# Patient Record
Sex: Male | Born: 1980 | Race: White | Hispanic: No | Marital: Married | Smoking: Never smoker
Health system: Southern US, Community
[De-identification: ages and names within clinical notes are randomized; demographics above are authoritative.]

## PROBLEM LIST (undated history)

## (undated) DIAGNOSIS — Z789 Other specified health status: Secondary | ICD-10-CM

## (undated) HISTORY — DX: Other specified health status: Z78.9

## (undated) HISTORY — PX: TOOTH EXTRACTION: SUR596

---

## 2016-12-12 ENCOUNTER — Telehealth: Payer: Self-pay | Admitting: Family Medicine

## 2016-12-12 ENCOUNTER — Ambulatory Visit: Payer: Self-pay | Admitting: Family Medicine

## 2016-12-12 NOTE — Telephone Encounter (Signed)
Pt lvm at 9:03 stating that he will not be able to make his appt.

## 2016-12-31 ENCOUNTER — Ambulatory Visit (INDEPENDENT_AMBULATORY_CARE_PROVIDER_SITE_OTHER): Payer: BC Managed Care – PPO | Admitting: Family Medicine

## 2016-12-31 ENCOUNTER — Encounter: Payer: Self-pay | Admitting: Family Medicine

## 2016-12-31 VITALS — BP 116/67 | HR 66 | Temp 98.5°F | Ht 70.0 in | Wt 188.6 lb

## 2016-12-31 DIAGNOSIS — E663 Overweight: Secondary | ICD-10-CM | POA: Diagnosis not present

## 2016-12-31 DIAGNOSIS — Z13220 Encounter for screening for lipoid disorders: Secondary | ICD-10-CM | POA: Diagnosis not present

## 2016-12-31 DIAGNOSIS — Z0000 Encounter for general adult medical examination without abnormal findings: Secondary | ICD-10-CM | POA: Diagnosis not present

## 2016-12-31 DIAGNOSIS — Z131 Encounter for screening for diabetes mellitus: Secondary | ICD-10-CM

## 2016-12-31 NOTE — Patient Instructions (Signed)
150 minutes of exercise per week (5 days, 30 minutes per day) that gets your heart rate up. Consider resistance exercise as well- weight lifting.

## 2016-12-31 NOTE — Progress Notes (Signed)
Chief Complaint  Patient presents with  . Establish Care    cpe    Well Male Gregory Dodson is here for a complete physical.  New pt, has never had a PCP since college. His last physical was >1 year ago.  Current diet: in general, a "healthy" diet   Current exercise: None right now Weight trend: stable Does pt snore? No. Daytime fatigue? No. Seat belt? Yes.    HIV screening- yes Flu- refuses Tdap- >10 years ago Blood work- never  Past Medical History:  Diagnosis Date  . No known problems     Past Surgical History:  Procedure Laterality Date  . TOOTH EXTRACTION     Medications  Takes no medications routinely.   Allergies No Known Allergies   Family History History reviewed. No pertinent family history.   Social History   Social History  . Marital status: Married   Social History Main Topics  . Smoking status: Never Smoker  . Smokeless tobacco: Never Used  . Alcohol use Yes  . Drug use: No   Review of Systems: Constitutional:  no unexpected change in weight, no fevers or chills Eye:  no recent significant change in vision Ear/Nose/Mouth/Throat:  Ears:  no tinnitus or hearing loss Nose/Mouth/Throat:  +nasal congestion (improving), no bleeding, no sore throat and oral sores Cardiovascular:  no chest pain, no palpitations Respiratory:  no cough and no shortness of breath Gastrointestinal:  no abdominal pain, no change in bowel habits, no nausea, vomiting, diarrhea, or constipation and no black or bloody stool GU:  Male: negative for dysuria, frequency, and incontinence and negative for prostate symptoms Musculoskeletal/Extremities:  no pain, redness, or swelling of the joints Integumentary (Skin/Breast):  no abnormal skin lesions reported Neurologic:  no headaches, no numbness, tingling Endocrine:  weight changes, masses in the neck, heat/cold intolerance, bowel or skin changes, or cardiovascular system symptoms Hematologic/Lymphatic:  no abnormal bleeding, no  HIV risk factors, no night sweats, no swollen nodes, no weight loss  Exam BP 116/67 (BP Location: Left Arm, Patient Position: Sitting, Cuff Size: Large)   Pulse 66   Temp 98.5 F (36.9 C) (Oral)   Ht 5\' 10"  (1.778 m)   Wt 188 lb 9.6 oz (85.5 kg)   SpO2 98%   BMI 27.06 kg/m  General:  well developed, well nourished, in no apparent distress Skin:  no significant moles, warts, or growths Head:  no masses, lesions, or tenderness Eyes:  pupils equal and round, sclera anicteric without injection Ears:  canals without lesions, TMs shiny without retraction, no obvious effusion, no erythema Nose:  nares patent, septum midline, mucosa normal Throat/Pharynx:  lips and gingiva without lesion; tongue and uvula midline; non-inflamed pharynx; no exudates or postnasal drainage Neck: neck supple without adenopathy, thyromegaly, or masses Lungs:  clear to auscultation, breath sounds equal bilaterally, no respiratory distress Cardio:  regular rate and rhythm without murmurs, heart sounds without clicks or rubs Abdomen:  abdomen soft, nontender; bowel sounds normal; no masses or organomegaly Genital (male): circumcised penis, no lesions or discharge; testes present bilaterally without masses or tenderness Rectal: Deferred Musculoskeletal:  symmetrical muscle groups noted without atrophy or deformity Extremities:  no clubbing, cyanosis, or edema, no deformities, no skin discoloration Neuro:  gait normal; deep tendon reflexes normal and symmetric Psych: well oriented with normal range of affect and appropriate judgment/insight  Assessment and Plan  Well adult exam  Screening cholesterol level - Plan: Lipid panel  Overweight (BMI 25.0-29.9) - Plan: Comprehensive metabolic panel  Screening for diabetes mellitus - Plan: Hemoglobin A1c   Well 36 y.o. male. Counseled on diet and exercise. Immunizations, labs, and further orders as above. Follow up in 1 year pending the above workup. The patient  voiced understanding and agreement to the plan.  Jilda Rocheicholas [place], DO 12/31/16 4:09 PM

## 2016-12-31 NOTE — Progress Notes (Signed)
Pre visit review using our clinic review tool, if applicable. No additional management support is needed unless otherwise documented below in the visit note. 

## 2017-01-01 LAB — COMPREHENSIVE METABOLIC PANEL
ALBUMIN: 4.6 g/dL (ref 3.5–5.2)
ALK PHOS: 46 U/L (ref 39–117)
ALT: 14 U/L (ref 0–53)
AST: 51 U/L — ABNORMAL HIGH (ref 0–37)
BILIRUBIN TOTAL: 0.5 mg/dL (ref 0.2–1.2)
BUN: 10 mg/dL (ref 6–23)
CO2: 35 mEq/L — ABNORMAL HIGH (ref 19–32)
Calcium: 9.4 mg/dL (ref 8.4–10.5)
Chloride: 102 mEq/L (ref 96–112)
Creatinine, Ser: 1.09 mg/dL (ref 0.40–1.50)
GFR: 80.54 mL/min (ref >60.00)
Glucose, Bld: 70 mg/dL (ref 70–99)
POTASSIUM: 4 meq/L (ref 3.5–5.1)
Sodium: 141 mEq/L (ref 135–145)
TOTAL PROTEIN: 6.9 g/dL (ref 6.0–8.3)

## 2017-01-01 LAB — LIPID PANEL
CHOLESTEROL: 203 mg/dL — AB (ref 0–200)
HDL: 51.3 mg/dL (ref >39.00)
LDL Cholesterol: 131 mg/dL — ABNORMAL HIGH (ref 0–99)
NonHDL: 151.29
Total CHOL/HDL Ratio: 4
Triglycerides: 101 mg/dL (ref 0.0–149.0)
VLDL: 20.2 mg/dL (ref 0.0–40.0)

## 2017-01-01 LAB — HEMOGLOBIN A1C: Hgb A1c MFr Bld: 5.4 % (ref 4.6–6.5)

## 2017-01-13 ENCOUNTER — Other Ambulatory Visit: Payer: Self-pay | Admitting: Family Medicine

## 2017-01-13 DIAGNOSIS — R740 Nonspecific elevation of levels of transaminase and lactic acid dehydrogenase [LDH]: Principal | ICD-10-CM

## 2017-01-13 DIAGNOSIS — R7401 Elevation of levels of liver transaminase levels: Secondary | ICD-10-CM

## 2017-01-15 ENCOUNTER — Other Ambulatory Visit (INDEPENDENT_AMBULATORY_CARE_PROVIDER_SITE_OTHER): Payer: BC Managed Care – PPO

## 2017-01-15 DIAGNOSIS — R740 Nonspecific elevation of levels of transaminase and lactic acid dehydrogenase [LDH]: Secondary | ICD-10-CM

## 2017-01-15 DIAGNOSIS — R7401 Elevation of levels of liver transaminase levels: Secondary | ICD-10-CM

## 2017-01-15 LAB — CBC WITH DIFFERENTIAL/PLATELET
BASOS PCT: 0.4 % (ref 0.0–3.0)
Basophils Absolute: 0 10*3/uL (ref 0.0–0.1)
EOS PCT: 2.5 % (ref 0.0–5.0)
Eosinophils Absolute: 0.2 10*3/uL (ref 0.0–0.7)
HCT: 44.9 % (ref 39.0–52.0)
Hemoglobin: 15.1 g/dL (ref 13.0–17.0)
LYMPHS ABS: 2.6 10*3/uL (ref 0.7–4.0)
Lymphocytes Relative: 37.5 % (ref 12.0–46.0)
MCHC: 33.7 g/dL (ref 30.0–36.0)
MCV: 89.1 fl (ref 78.0–100.0)
MONOS PCT: 7.8 % (ref 3.0–12.0)
Monocytes Absolute: 0.5 10*3/uL (ref 0.1–1.0)
NEUTROS ABS: 3.5 10*3/uL (ref 1.4–7.7)
NEUTROS PCT: 51.8 % (ref 43.0–77.0)
Platelets: 229 10*3/uL (ref 150.0–400.0)
RBC: 5.04 Mil/uL (ref 4.22–5.81)
RDW: 13.7 % (ref 11.5–15.5)
WBC: 6.8 10*3/uL (ref 4.0–10.5)

## 2017-01-15 LAB — HEPATIC FUNCTION PANEL
ALK PHOS: 41 U/L (ref 39–117)
ALT: 15 U/L (ref 0–53)
AST: 13 U/L (ref 0–37)
Albumin: 4.5 g/dL (ref 3.5–5.2)
BILIRUBIN DIRECT: 0.1 mg/dL (ref 0.0–0.3)
Total Bilirubin: 0.5 mg/dL (ref 0.2–1.2)
Total Protein: 7.1 g/dL (ref 6.0–8.3)

## 2017-01-15 LAB — IBC PANEL
IRON: 82 ug/dL (ref 42–165)
Saturation Ratios: 29.6 % (ref 20.0–50.0)
Transferrin: 198 mg/dL — ABNORMAL LOW (ref 212.0–360.0)

## 2017-01-16 LAB — HEPATITIS C ANTIBODY: HCV Ab: NEGATIVE

## 2017-01-16 LAB — HEPATITIS B SURFACE ANTIGEN: Hepatitis B Surface Ag: NEGATIVE

## 2017-11-30 ENCOUNTER — Other Ambulatory Visit: Payer: Self-pay | Admitting: Family Medicine

## 2017-11-30 ENCOUNTER — Telehealth: Payer: Self-pay

## 2017-11-30 DIAGNOSIS — Z3009 Encounter for other general counseling and advice on contraception: Secondary | ICD-10-CM

## 2017-11-30 NOTE — Telephone Encounter (Signed)
Please refer to urology. TY.

## 2017-11-30 NOTE — Telephone Encounter (Signed)
Referral entered  

## 2017-11-30 NOTE — Telephone Encounter (Signed)
Copied from CRM [id number]. Topic: Referral - Request >> Nov 30, 2017 10:34 AM Jolayne Hainesaylor, Brittany L wrote: Reason for CRM: Pt is requesting a referral for vasectomy. Please call back @ [phone]

## 2017-11-30 NOTE — Telephone Encounter (Signed)
Please advise 

## 2018-01-04 ENCOUNTER — Encounter: Payer: Self-pay | Admitting: Family Medicine

## 2018-01-04 ENCOUNTER — Ambulatory Visit (INDEPENDENT_AMBULATORY_CARE_PROVIDER_SITE_OTHER): Payer: BC Managed Care – PPO | Admitting: Family Medicine

## 2018-01-04 VITALS — BP 108/62 | HR 81 | Temp 98.6°F | Ht 70.0 in | Wt 193.0 lb

## 2018-01-04 DIAGNOSIS — Z0000 Encounter for general adult medical examination without abnormal findings: Secondary | ICD-10-CM

## 2018-01-04 LAB — COMPREHENSIVE METABOLIC PANEL
ALT: 17 U/L (ref 0–53)
AST: 24 U/L (ref 0–37)
Albumin: 4.3 g/dL (ref 3.5–5.2)
Alkaline Phosphatase: 36 U/L — ABNORMAL LOW (ref 39–117)
BUN: 14 mg/dL (ref 6–23)
CHLORIDE: 103 meq/L (ref 96–112)
CO2: 31 meq/L (ref 19–32)
Calcium: 9.6 mg/dL (ref 8.4–10.5)
Creatinine, Ser: 0.95 mg/dL (ref 0.40–1.50)
GFR: 93.88 mL/min (ref >60.00)
GLUCOSE: 87 mg/dL (ref 70–99)
POTASSIUM: 4.4 meq/L (ref 3.5–5.1)
SODIUM: 141 meq/L (ref 135–145)
Total Bilirubin: 0.5 mg/dL (ref 0.2–1.2)
Total Protein: 6.9 g/dL (ref 6.0–8.3)

## 2018-01-04 LAB — LIPID PANEL
CHOL/HDL RATIO: 3
Cholesterol: 174 mg/dL (ref 0–200)
HDL: 61.9 mg/dL (ref >39.00)
LDL Cholesterol: 99 mg/dL (ref 0–99)
NONHDL: 112.57
Triglycerides: 69 mg/dL (ref 0.0–149.0)
VLDL: 13.8 mg/dL (ref 0.0–40.0)

## 2018-01-04 NOTE — Progress Notes (Signed)
Pre visit review using our clinic review tool, if applicable. No additional management support is needed unless otherwise documented below in the visit note. 

## 2018-01-04 NOTE — Progress Notes (Signed)
Chief Complaint  Patient presents with  . Annual Exam    Well Male Gregory Dodson is here for a complete physical.   His last physical was >1 year ago.  Current diet: in general, a "healthy" diet   Current exercise: lifting weights, some cardio Weight trend: stable Does pt snore? No. Daytime fatigue? No. Seat belt? Yes.     Health maintenance Tetanus- Yes 1 yr 12/31/16 HIV- Yes  Past Medical History:  Diagnosis Date  . No known problems     Past Surgical History:  Procedure Laterality Date  . TOOTH EXTRACTION     Medications  Takes no meds routinely.   Allergies No Known Allergies   Family History History reviewed. No pertinent family history.  Review of Systems: Constitutional: no fevers or chills Eye:  no recent significant change in vision Ear/Nose/Mouth/Throat:  Ears:  no tinnitus or hearing loss Nose/Mouth/Throat:  no complaints of nasal congestion or bleeding, no sore throat Cardiovascular:  no chest pain, no palpitations Respiratory:  no cough and no shortness of breath Gastrointestinal:  no abdominal pain, no change in bowel habits, no nausea, vomiting, diarrhea, or constipation and no black or bloody stool GU:  Male: negative for dysuria, frequency, and incontinence and negative for prostate symptoms Musculoskeletal/Extremities:  no pain, redness, or swelling of the joints Integumentary (Skin/Breast): Lesion on back that his wife wanted looked at; otherwise no abnormal skin lesions reported Neurologic:  no headaches, no numbness, tingling Endocrine: No unexpected weight changes Hematologic/Lymphatic:  no night sweats  Exam BP 108/62 (BP Location: Left Arm, Patient Position: Sitting, Cuff Size: Normal)   Pulse 81   Temp 98.6 F (37 C) (Oral)   Ht 5\' 10"  (1.778 m)   Wt 193 lb (87.5 kg)   SpO2 97%   BMI 27.69 kg/m  General:  well developed, well nourished, in no apparent distress Skin: see below; otherwise no significant moles, warts, or growths Head:   no masses, lesions, or tenderness Eyes:  pupils equal and round, sclera anicteric without injection Ears:  canals without lesions, TMs shiny without retraction, no obvious effusion, no erythema Nose:  nares patent, septum midline, mucosa normal Throat/Pharynx:  lips and gingiva without lesion; tongue and uvula midline; non-inflamed pharynx; no exudates or postnasal drainage Neck: neck supple without adenopathy, thyromegaly, or masses Lungs:  clear to auscultation, breath sounds equal bilaterally, no respiratory distress Cardio:  regular rate and rhythm without murmurs, heart sounds without clicks or rubs Abdomen:  abdomen soft, nontender; bowel sounds normal; no masses or organomegaly Genital (male): circumcised penis, no lesions or discharge; testes present bilaterally without masses or tenderness Rectal: Deferred Musculoskeletal:  symmetrical muscle groups noted without atrophy or deformity Extremities:  no clubbing, cyanosis, or edema, no deformities, no skin discoloration Neuro:  gait normal; deep tendon reflexes normal and symmetric Psych: well oriented with normal range of affect and appropriate judgment/insight    Assessment and Plan  Well adult exam - Plan: Comprehensive metabolic panel, Lipid panel   Well 37 y.o. male. Counseled on diet and exercise. Doing well.  Skin lesions looks like a junctional nevus, offered to remove to be certain, he would like to monitor.  Other orders as above. Follow up in 1 year pending the above workup. The patient voiced understanding and agreement to the plan.  Jilda Rocheicholas [place], DO 01/04/18 8:50 AM

## 2018-01-04 NOTE — Patient Instructions (Signed)
We will send you a MyChart message regarding your lab results.   Let us know if you need anything.

## 2019-01-14 ENCOUNTER — Ambulatory Visit (INDEPENDENT_AMBULATORY_CARE_PROVIDER_SITE_OTHER): Payer: BC Managed Care – PPO | Admitting: Family Medicine

## 2019-01-14 ENCOUNTER — Encounter: Payer: Self-pay | Admitting: Family Medicine

## 2019-01-14 VITALS — BP 102/62 | HR 74 | Temp 98.3°F | Ht 70.0 in | Wt 182.0 lb

## 2019-01-14 DIAGNOSIS — Z0000 Encounter for general adult medical examination without abnormal findings: Secondary | ICD-10-CM | POA: Diagnosis not present

## 2019-01-14 LAB — COMPREHENSIVE METABOLIC PANEL
ALBUMIN: 4.6 g/dL (ref 3.5–5.2)
ALT: 11 U/L (ref 0–53)
AST: 13 U/L (ref 0–37)
Alkaline Phosphatase: 41 U/L (ref 39–117)
BUN: 14 mg/dL (ref 6–23)
CALCIUM: 9.7 mg/dL (ref 8.4–10.5)
CHLORIDE: 102 meq/L (ref 96–112)
CO2: 32 meq/L (ref 19–32)
Creatinine, Ser: 1.06 mg/dL (ref 0.40–1.50)
GFR: 77.43 mL/min (ref >60.00)
Glucose, Bld: 90 mg/dL (ref 70–99)
POTASSIUM: 4.4 meq/L (ref 3.5–5.1)
SODIUM: 139 meq/L (ref 135–145)
Total Bilirubin: 0.7 mg/dL (ref 0.2–1.2)
Total Protein: 6.8 g/dL (ref 6.0–8.3)

## 2019-01-14 LAB — LIPID PANEL
CHOL/HDL RATIO: 4
CHOLESTEROL: 190 mg/dL (ref 0–200)
HDL: 53.5 mg/dL (ref >39.00)
LDL CALC: 125 mg/dL — AB (ref 0–99)
NonHDL: 136.24
TRIGLYCERIDES: 55 mg/dL (ref 0.0–149.0)
VLDL: 11 mg/dL (ref 0.0–40.0)

## 2019-01-14 NOTE — Patient Instructions (Addendum)
Give us 2-3 business days to get the results of your labs back.   Keep the diet clean and stay active.  Do monthly self testicular checks in the shower. You are feeling for lumps/bumps that don't belong. If you feel anything like this, let me know!  Let us know if you need anything. 

## 2019-01-14 NOTE — Progress Notes (Signed)
Chief Complaint  Patient presents with  . Annual Exam    Well Male Gregory Dodson is here for a complete physical.   His last physical was >1 year ago.  Current diet: in general, convenient diet, fair overall Current exercise: [employer] [facility] Weight trend: has lost a some weight Daytime fatigue? No. Seat belt? Yes.    Health maintenance Tetanus- Yes HIV- Yes  Past Medical History:  Diagnosis Date  . No known problems      Past Surgical History:  Procedure Laterality Date  . TOOTH EXTRACTION      Medications  Takes no meds routinely.   Allergies No Known Allergies  Family History Family History  Problem Relation Age of Onset  . Cancer Neg Hx     Review of Systems: Constitutional: no fevers or chills Eye:  no recent significant change in vision Ear/Nose/Mouth/Throat:  Ears:  no tinnitus or hearing loss Nose/Mouth/Throat:  no complaints of nasal congestion, no sore throat Cardiovascular:  no chest pain, no palpitations Respiratory:  no cough and no shortness of breath Gastrointestinal:  no abdominal pain, no change in bowel habits GU:  Male: negative for dysuria, frequency, and incontinence and negative for prostate symptoms Musculoskeletal/Extremities:  no pain, redness, or swelling of the joints Integumentary (Skin/Breast):  no abnormal skin lesions reported Neurologic:  no headaches, no numbness, tingling Endocrine: No unexpected weight changes Hematologic/Lymphatic:  no night sweats  Exam BP 102/62 (BP Location: Left Arm, Patient Position: Sitting, Cuff Size: Normal)   Pulse 74   Temp 98.3 F (36.8 C) (Oral)   Ht 5\' 10"  (1.778 m)   Wt 182 lb (82.6 kg)   SpO2 98%   BMI 26.11 kg/m  General:  well developed, well nourished, in no apparent distress Skin:  no significant moles, warts, or growths Head:  no masses, lesions, or tenderness Eyes:  pupils equal and round, sclera anicteric without injection Ears:  canals without lesions, TMs shiny without  retraction, no obvious effusion, no erythema Nose:  nares patent, septum midline, mucosa normal Throat/Pharynx:  lips and gingiva without lesion; tongue and uvula midline; non-inflamed pharynx; no exudates or postnasal drainage Neck: neck supple without adenopathy, thyromegaly, or masses Lungs:  clear to auscultation, breath sounds equal bilaterally, no respiratory distress Cardio:  regular rate and rhythm, no bruits, no LE edema Abdomen:  abdomen soft, nontender; bowel sounds normal; no masses or organomegaly Genital (male): circumcised penis, no lesions or discharge; testes present bilaterally without masses or tenderness Rectal: Deferred Musculoskeletal:  symmetrical muscle groups noted without atrophy or deformity Extremities:  no clubbing, cyanosis, or edema, no deformities, no skin discoloration Neuro:  gait normal; deep tendon reflexes normal and symmetric Psych: well oriented with normal range of affect and appropriate judgment/insight  Assessment and Plan  Well adult exam - Plan: Lipid panel, Comprehensive metabolic panel   Well 38 y.o. male. Counseled on diet and exercise. Declines flu shot. Other orders as above. Follow up in 1 year pending the above workup. The patient voiced understanding and agreement to the plan.  Jilda Roche [place], DO 01/14/19 10:34 AM

## 2019-05-05 ENCOUNTER — Emergency Department (HOSPITAL_BASED_OUTPATIENT_CLINIC_OR_DEPARTMENT_OTHER): Payer: BC Managed Care – PPO

## 2019-05-05 ENCOUNTER — Other Ambulatory Visit: Payer: Self-pay

## 2019-05-05 ENCOUNTER — Emergency Department (HOSPITAL_BASED_OUTPATIENT_CLINIC_OR_DEPARTMENT_OTHER)
Admission: EM | Admit: 2019-05-05 | Discharge: 2019-05-05 | Disposition: A | Discharge status: Home / Self Care | Payer: BC Managed Care – PPO | Attending: Emergency Medicine | Admitting: Emergency Medicine

## 2019-05-05 ENCOUNTER — Encounter (HOSPITAL_BASED_OUTPATIENT_CLINIC_OR_DEPARTMENT_OTHER): Payer: Self-pay | Admitting: *Deleted

## 2019-05-05 DIAGNOSIS — S62315A Displaced fracture of base of fourth metacarpal bone, left hand, initial encounter for closed fracture: Secondary | ICD-10-CM | POA: Diagnosis not present

## 2019-05-05 DIAGNOSIS — Y999 Unspecified external cause status: Secondary | ICD-10-CM | POA: Insufficient documentation

## 2019-05-05 DIAGNOSIS — Y939 Activity, unspecified: Secondary | ICD-10-CM | POA: Diagnosis not present

## 2019-05-05 DIAGNOSIS — S62337A Displaced fracture of neck of fifth metacarpal bone, left hand, initial encounter for closed fracture: Secondary | ICD-10-CM | POA: Insufficient documentation

## 2019-05-05 DIAGNOSIS — S62319A Displaced fracture of base of unspecified metacarpal bone, initial encounter for closed fracture: Secondary | ICD-10-CM

## 2019-05-05 DIAGNOSIS — Y929 Unspecified place or not applicable: Secondary | ICD-10-CM | POA: Insufficient documentation

## 2019-05-05 DIAGNOSIS — W2209XA Striking against other stationary object, initial encounter: Secondary | ICD-10-CM | POA: Diagnosis not present

## 2019-05-05 DIAGNOSIS — S6992XA Unspecified injury of left wrist, hand and finger(s), initial encounter: Secondary | ICD-10-CM | POA: Diagnosis present

## 2019-05-05 MED ORDER — HYDROCODONE-ACETAMINOPHEN 5-325 MG PO TABS: 1.0000 | ORAL_TABLET | Freq: Four times a day (QID) | ORAL | 0 refills | Status: DC | PRN

## 2019-05-05 NOTE — ED Triage Notes (Signed)
Pt c/o left hand injury x 1 day

## 2019-05-05 NOTE — ED Provider Notes (Signed)
[facility] EMERGENCY DEPARTMENT Provider Note   CSN: [id number] Arrival date & time: 05/05/19  2122  History   Chief Complaint Chief Complaint  Patient presents with  . Hand Injury    HPI Gregory Dodson is a 38 y.o. male with no significant past medical history who presents for evaluation of hand injury.  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  Reoccurred to his left hand.  Patient has tenderness to his lateral metacarpals on his left upper extremity.  He has no overlying swelling, redness, warmth, lacerations, abrasions, contusions.  He is not take anything for his pain.  He rates his pain a 4/10.  Pain does not radiate.  He denies decreased range of motion or numbness or tingling in his extremities.  Symptom occurred 1 hour PTA.  Denies additional aggravating or alleviating factors.  He has not followed with orthopedics previously.  History obtained from patient.  No interpreter is used.     HPI  Past Medical History:  Diagnosis Date  . No known problems     There are no active problems to display for this patient.   Past Surgical History:  Procedure Laterality Date  . TOOTH EXTRACTION          Home Medications    Prior to Admission medications   Medication Sig Start Date End Date Taking? Authorizing Provider  HYDROcodone-acetaminophen (NORCO/VICODIN) 5-325 MG tablet Take 1 tablet by mouth every 6 (six) hours as needed for severe pain. 05/05/19   Maridee Slape A, PA-C    Family History Family History  Problem Relation Age of Onset  . Cancer Neg Hx     Social History Social History   Tobacco Use  . Smoking status: Never Smoker  . Smokeless tobacco: Never Used  Substance Use Topics  . Alcohol use: Yes    Comment: occ  . Drug use: No     Allergies   Patient has no known allergies.   Review of Systems Review of Systems  Constitutional: Negative.   HENT: Negative.   Respiratory: Negative.    Cardiovascular: Negative.   Gastrointestinal: Negative.   Genitourinary: Negative.   Musculoskeletal:       Left hand pain  Skin: Negative.   Neurological: Negative.   All other systems reviewed and are negative.    Physical Exam Updated Vital Signs BP 132/86   Pulse 87   Temp 98.7 F (37.1 C)   Resp 18   Ht  (1.778 m)   Wt 81.6 kg   SpO2 100%   BMI 25.83 kg/m   Physical Exam Vitals signs and nursing note reviewed.  Constitutional:      General: He is not in acute distress.    Appearance: He is well-developed. He is not ill-appearing, toxic-appearing or diaphoretic.  HENT:     Head: Normocephalic and atraumatic.     Nose: Nose normal.     Mouth/Throat:     Mouth: Mucous membranes are moist.     Pharynx: Oropharynx is clear.  Eyes:     Pupils: Pupils are equal, round, and reactive to light.  Neck:     Musculoskeletal: Normal range of motion and neck supple.  Cardiovascular:     Rate and Rhythm: Normal rate and regular rhythm.     Pulses: Normal pulses.     Heart sounds: Normal heart sounds. No murmur. No friction rub. No gallop.   Pulmonary:     Effort:  Pulmonary effort is normal. No respiratory distress.     Breath sounds: Normal breath sounds. No stridor. No wheezing, rhonchi or rales.  Chest:     Chest wall: No tenderness.  Abdominal:     General: Bowel sounds are normal. There is no distension.     Palpations: Abdomen is soft.     Tenderness: There is no abdominal tenderness. There is no guarding or rebound.  Musculoskeletal: Normal range of motion.     Comments: Range of motion bilateral upper extremities without difficulty.  He does have tenderness with mild swelling over his fourth and fifth metacarpal on his left upper extremity.  Compartments are soft.  Able to flex and extend at bilateral wrist.  He has no tenderness to his radius or ulna.  Moves digits without difficulty.  No tenderness over scaphoid.  Skin:    General: Skin is warm and dry.      Comments: No edema, erythema, ecchymosis or warmth.  Soft tissue swelling over fourth/fifth digit to his left upper extremity.  No overlying lacerations, contusions or abrasions.  He has brisk capillary refill.  Neurological:     Mental Status: He is alert.     Comments: 5/5 strength bilateral upper extremities no difficulty.  Ambulatory in ED without difficulty.    ED Treatments / Results  Labs (all labs ordered are listed, but only abnormal results are displayed) Labs Reviewed - No data to display  EKG None  Radiology Dg Hand Complete Left  Result Date: 05/05/2019 CLINICAL DATA:  Pain EXAM: LEFT HAND - COMPLETE 3+ VIEW COMPARISON:  None. FINDINGS: There are acute impacted fractures involving the base of the fifth and fourth metacarpals. There is surrounding soft tissue swelling. There is significant angulation of the distal fracture fragments. IMPRESSION: Acute fractures of the fourth and fifth metacarpals with significant ventral angulation of the distal fracture fragments. Electronically Signed   By: Katherine Mantle M.D.   On: 05/05/2019 21:45    Procedures Procedures (including critical care time)  Medications Ordered in ED Medications - No data to display   Initial Impression / Assessment and Plan / ED Course  I have reviewed the triage vital signs and the nursing notes.  Pertinent labs & imaging results that were available during my care of the patient were reviewed by me and considered in my medical decision making (see chart for details).  38 year old appears otherwise well presents for evaluation of left hand injury. Afebrile, nonseptic, non-ill-appearing.  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  He has tenderness with soft tissue swelling over the fourth and fifth metacarpal to his left upper extremity.  Normal musculoskeletal exam.  Neurovascularly intact.  No overlying lacerations, abrasions or contusions.  No open fracture. He has no evidence of  infectious process.  Plain film x-ray shows acute fractures of the fourth and fifth metacarpals with ventral angulation of distal fracture fragments.  Compartments soft. Patient placed in ulnar gutter splint with trial to reset fractures. Will have him follow-up with hand surgery/orthopedics. Patient continues to be neurovascular intact after splint placement.  Does not want anything for pain at this time. Will DC home with short course of pain medicine.  The patient has been appropriately medically screened and/or stabilized in the ED. I have low suspicion for any other emergent medical condition which would require further screening, evaluation or treatment in the ED or require inpatient management.  Patient is hemodynamically stable and in no acute distress.  Patient able  to ambulate in department prior to ED.  Evaluation does not show acute pathology that would require ongoing or additional emergent interventions while in the emergency department or further inpatient treatment.  I have discussed the diagnosis with the patient and answered all questions.  Pain is been managed while in the emergency department and patient has no further complaints prior to discharge.  Patient is comfortable with plan discussed in room and is stable for discharge at this time.  I have discussed strict return precautions for returning to the emergency department.  Patient was encouraged to follow-up with PCP/specialist refer to at discharge.     Discussed with attending Dr. Judd Lienelo who recommends Ulnar gutter splint with hand Surgery FU. Final Clinical Impressions(s) / ED Diagnoses   Final diagnoses:  Closed displaced fracture of base of metacarpal bone, unspecified fracture morphology, unspecified metacarpal, initial encounter    ED Discharge Orders         Ordered    HYDROcodone-acetaminophen (NORCO/VICODIN) 5-325 MG tablet  Every 6 hours PRN     05/05/19 2227           Daaiel Starlin A, PA-C 05/05/19 2247     Geoffery Lyonselo, [place], MD 05/06/19 2249

## 2019-05-05 NOTE — Discharge Instructions (Signed)
Your evaluated today for hand injury.  You do have fractures to 2 of the bones in your hand.  You are placed in a splint.  You will need to follow-up with orthopedic hand surgery.  I have referred you to 1 your discharge paperwork.  You will need to call to schedule an appointment.  I suggest calling tomorrow to schedule this appointment.  Also given you a prescription for pain medicine.  Please take as prescribed.  Do not drive or operate heavy machinery while taking this medicine.  Return to the ED for any new or worsening symptoms.

## 2019-10-30 IMAGING — CR LEFT HAND - COMPLETE 3+ VIEW
3 series · 3 of 3 positions shown · non-contrast
Comparison: None.

CLINICAL DATA: Pain

EXAM:
LEFT HAND - COMPLETE 3+ VIEW

[x hand pa left]
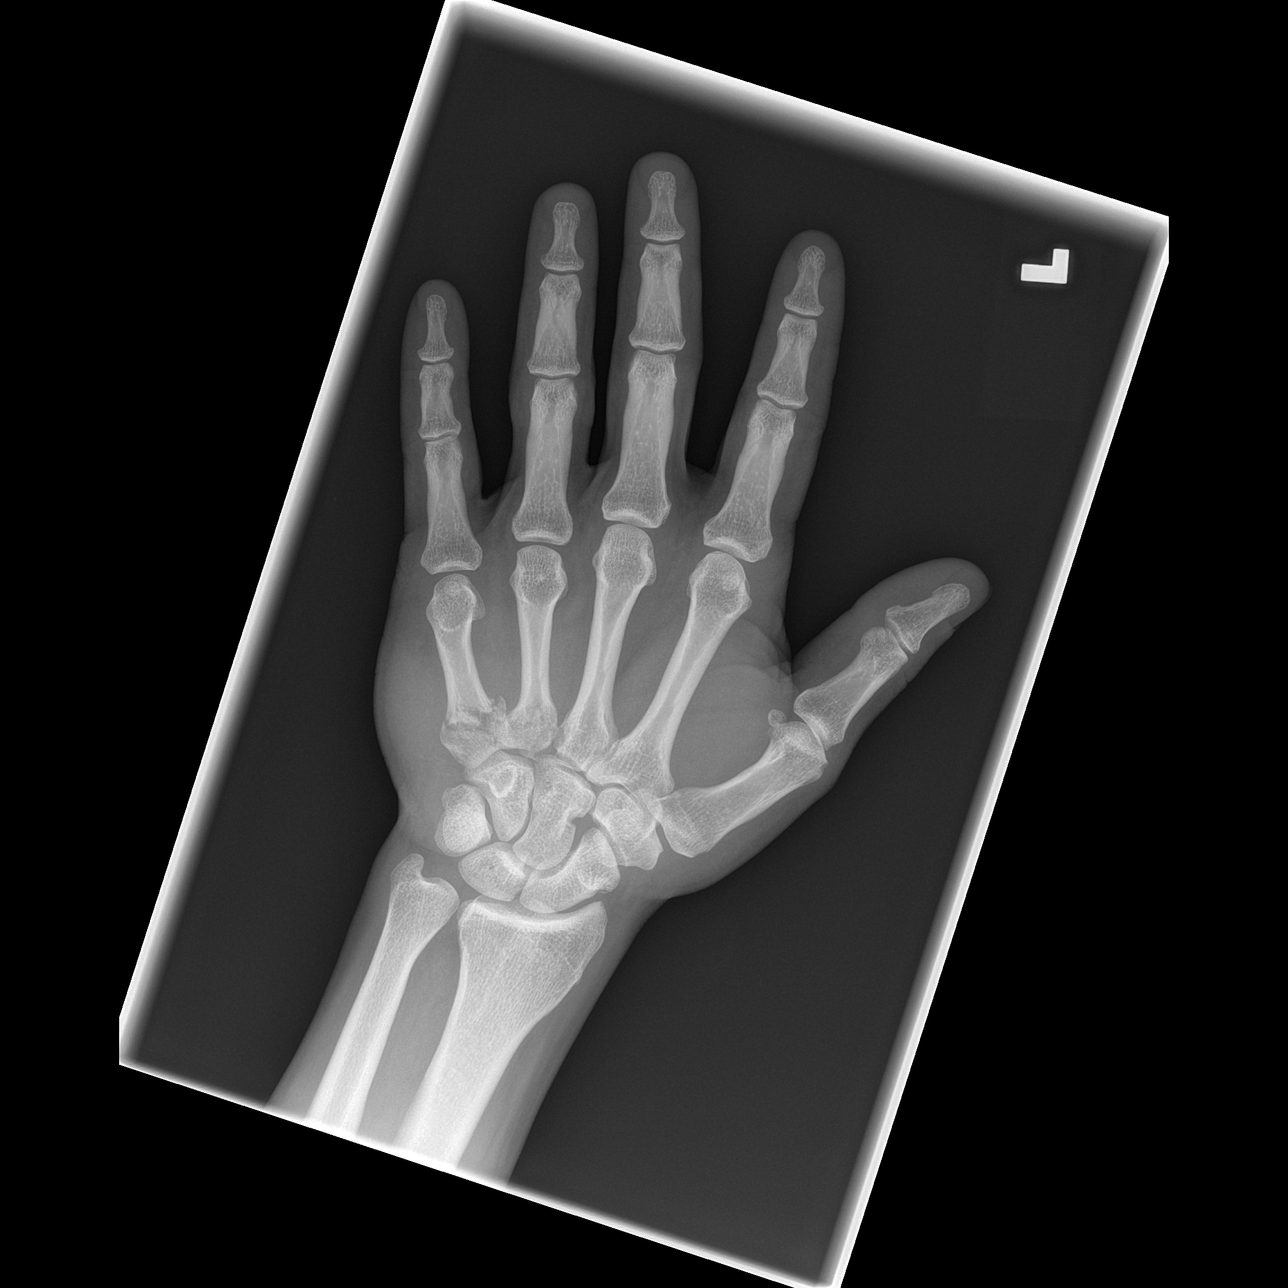

[x hand oblique left]
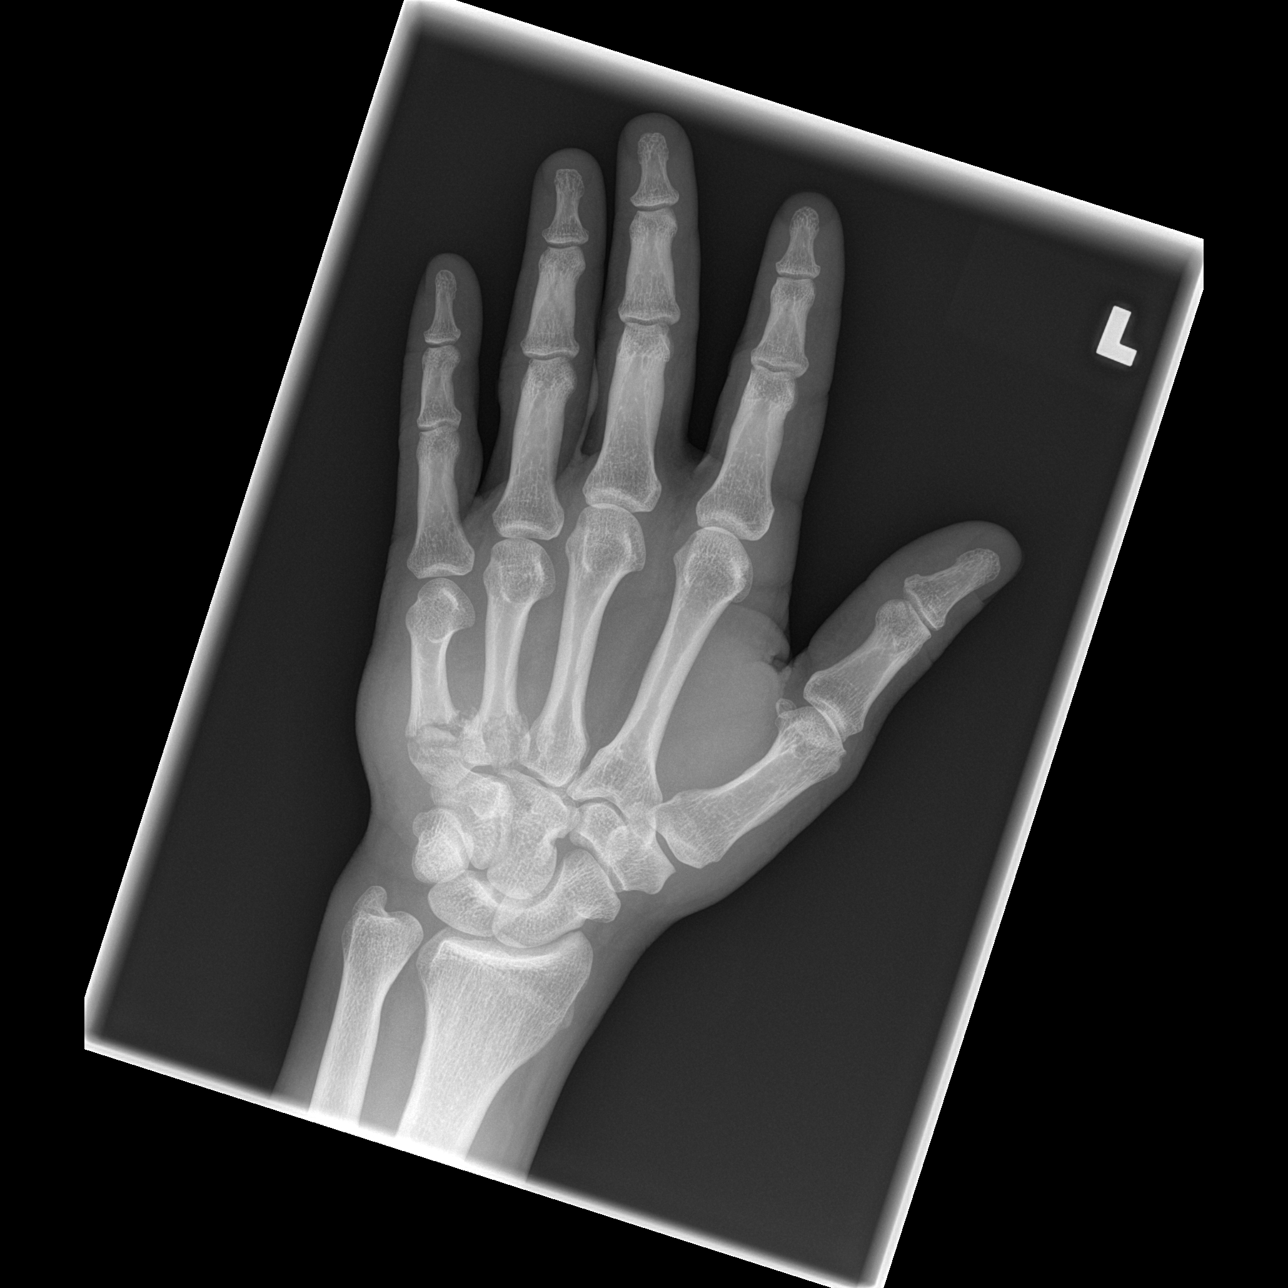

[x hand lat left]
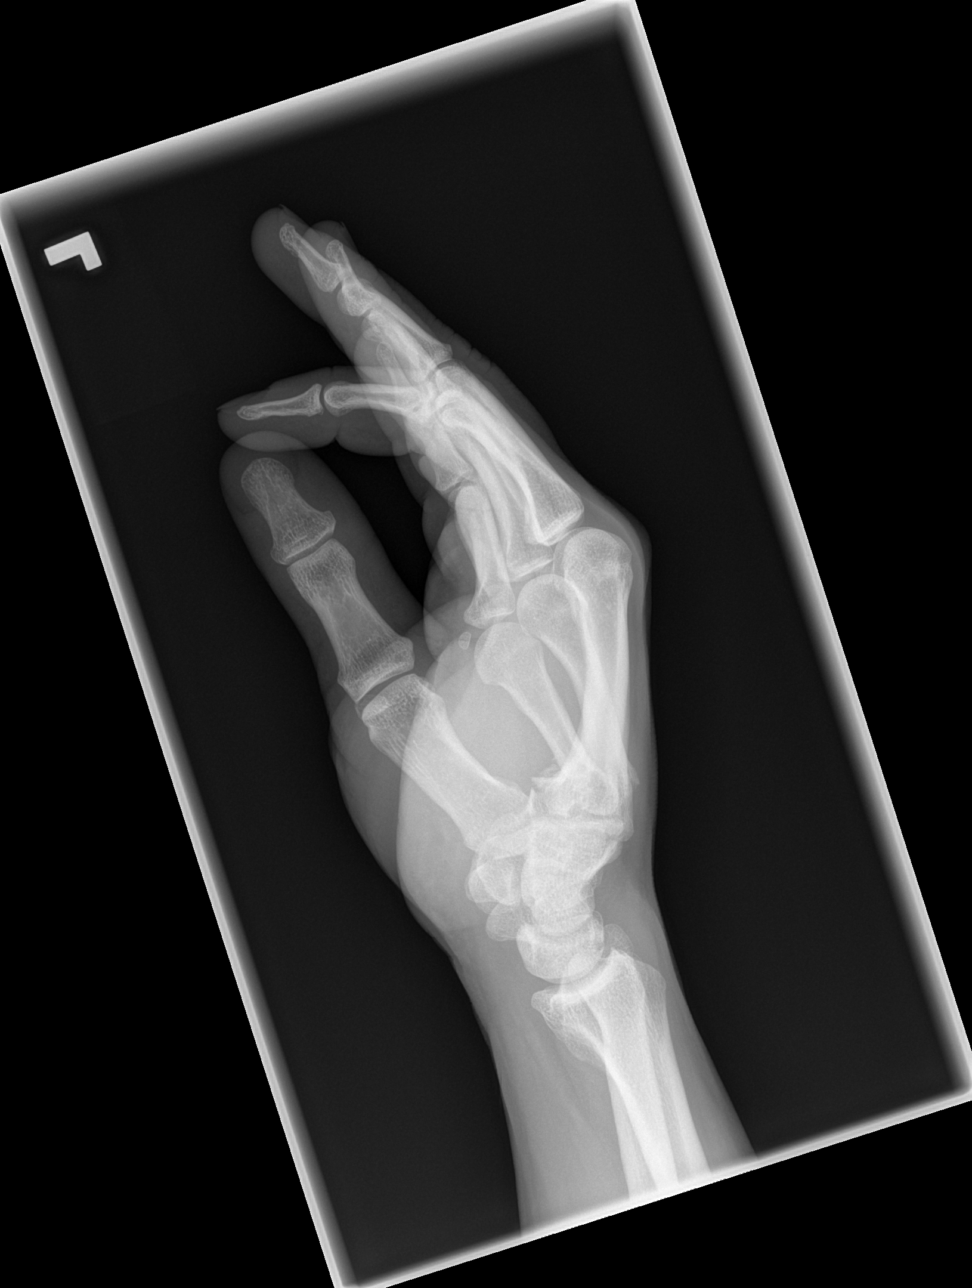

[3 of 3 positions shown; findings below may reference images not displayed]

FINDINGS: There are acute impacted fractures involving the base of the fifth
and fourth metacarpals. There is surrounding soft tissue swelling.
There is significant angulation of the distal fracture fragments.
IMPRESSION: Acute fractures of the fourth and fifth metacarpals with significant
ventral angulation of the distal fracture fragments.

## 2020-03-13 ENCOUNTER — Other Ambulatory Visit: Payer: Self-pay

## 2020-03-14 ENCOUNTER — Encounter: Payer: Self-pay | Admitting: Family Medicine

## 2020-03-14 ENCOUNTER — Ambulatory Visit (INDEPENDENT_AMBULATORY_CARE_PROVIDER_SITE_OTHER): Payer: BC Managed Care – PPO | Admitting: Family Medicine

## 2020-03-14 VITALS — BP 102/62 | HR 67 | Temp 96.4°F | Ht 70.0 in | Wt 186.1 lb

## 2020-03-14 DIAGNOSIS — Z0000 Encounter for general adult medical examination without abnormal findings: Secondary | ICD-10-CM | POA: Diagnosis not present

## 2020-03-14 LAB — COMPREHENSIVE METABOLIC PANEL
ALT: 13 U/L (ref 0–53)
AST: 13 U/L (ref 0–37)
Albumin: 4.5 g/dL (ref 3.5–5.2)
Alkaline Phosphatase: 40 U/L (ref 39–117)
BUN: 16 mg/dL (ref 6–23)
CO2: 29 mEq/L (ref 19–32)
Calcium: 9.4 mg/dL (ref 8.4–10.5)
Chloride: 102 mEq/L (ref 96–112)
Creatinine, Ser: 1.01 mg/dL (ref 0.40–1.50)
GFR: 81.39 mL/min (ref >60.00)
Glucose, Bld: 89 mg/dL (ref 70–99)
Potassium: 4.2 mEq/L (ref 3.5–5.1)
Sodium: 138 mEq/L (ref 135–145)
Total Bilirubin: 0.8 mg/dL (ref 0.2–1.2)
Total Protein: 6.6 g/dL (ref 6.0–8.3)

## 2020-03-14 LAB — CBC
HCT: 43.4 % (ref 39.0–52.0)
Hemoglobin: 14.8 g/dL (ref 13.0–17.0)
MCHC: 34.1 g/dL (ref 30.0–36.0)
MCV: 89.8 fl (ref 78.0–100.0)
Platelets: 196 10*3/uL (ref 150.0–400.0)
RBC: 4.83 Mil/uL (ref 4.22–5.81)
RDW: 13.3 % (ref 11.5–15.5)
WBC: 5.3 10*3/uL (ref 4.0–10.5)

## 2020-03-14 LAB — LIPID PANEL
Cholesterol: 194 mg/dL (ref 0–200)
HDL: 53.9 mg/dL (ref >39.00)
LDL Cholesterol: 128 mg/dL — ABNORMAL HIGH (ref 0–99)
NonHDL: 140.41
Total CHOL/HDL Ratio: 4
Triglycerides: 64 mg/dL (ref 0.0–149.0)
VLDL: 12.8 mg/dL (ref 0.0–40.0)

## 2020-03-14 NOTE — Patient Instructions (Signed)
Give us 2-3 business days to get the results of your labs back.   Keep the diet clean and stay active.  Let us know if you need anything. 

## 2020-03-14 NOTE — Progress Notes (Signed)
Chief Complaint  Patient presents with  . Annual Exam    Well Male Gregory Dodson is here for a complete physical.   His last physical was >1 year ago.  Current diet: in general, a "healthy" diet.   Current exercise: cycling, wt resistance exercises Weight trend: stable Daytime fatigue? No. Seat belt? Yes.    Health maintenance Tetanus- Yes HIV- Yes  Past Medical History:  Diagnosis Date  . No known problems      Past Surgical History:  Procedure Laterality Date  . TOOTH EXTRACTION      Medications  Takes no meds routinely.   Allergies No Known Allergies  Family History Family History  Problem Relation Age of Onset  . Colon polyps Father   . Cancer Neg Hx     Review of Systems: Constitutional: no fevers or chills Eye:  no recent significant change in vision Ear/Nose/Mouth/Throat:  Ears:  no hearing loss Nose/Mouth/Throat:  no complaints of nasal congestion, no sore throat Cardiovascular:  no chest pain Respiratory:  no shortness of breath Gastrointestinal:  no abdominal pain, no change in bowel habits GU:  Male: negative for dysuria, frequency, and incontinence Musculoskeletal/Extremities:  no pain of the joints Integumentary (Skin/Breast):  no abnormal skin lesions reported Neurologic:  no headaches Endocrine: No unexpected weight changes Hematologic/Lymphatic:  no night sweats  Exam BP 102/62 (BP Location: Left Arm, Patient Position: Sitting, Cuff Size: Normal)   Pulse 67   Temp (!) 96.4 F (35.8 C) (Temporal)   Ht 5\' 10"  (1.778 m)   Wt 186 lb 2 oz (84.4 kg)   SpO2 98%   BMI 26.71 kg/m  General:  well developed, well nourished, in no apparent distress Skin:  no significant moles, warts, or growths Head:  no masses, lesions, or tenderness Eyes:  pupils equal and round, sclera anicteric without injection Ears:  canals without lesions, TMs shiny without retraction, no obvious effusion, no erythema Nose:  nares patent, septum midline, mucosa  normal Throat/Pharynx:  lips and gingiva without lesion; tongue and uvula midline; non-inflamed pharynx; no exudates or postnasal drainage Neck: neck supple without adenopathy, thyromegaly, or masses Lungs:  clear to auscultation, breath sounds equal bilaterally, no respiratory distress Cardio:  regular rate and rhythm, no bruits, no LE edema Abdomen:  abdomen soft, nontender; bowel sounds normal; no masses or organomegaly Rectal: Deferred Musculoskeletal:  symmetrical muscle groups noted without atrophy or deformity Extremities:  no clubbing, cyanosis, or edema, no deformities, no skin discoloration Neuro:  gait normal; deep tendon reflexes normal and symmetric Psych: well oriented with normal range of affect and appropriate judgment/insight  Assessment and Plan  Well adult exam - Plan: CBC, Comprehensive metabolic panel, Lipid panel   Well 39 y.o. male. Counseled on diet and exercise. Counseled on risks and benefits of prostate cancer screening with PSA. The patient agrees to forego screening.  Other orders as above. Follow up in 1 year pending the above workup. The patient voiced understanding and agreement to the plan.  46 [place], DO 03/14/20 9:01 AM

## 2021-03-18 ENCOUNTER — Encounter: Payer: BC Managed Care – PPO | Admitting: Family Medicine

## 2021-04-22 ENCOUNTER — Other Ambulatory Visit: Payer: Self-pay

## 2021-04-22 ENCOUNTER — Encounter: Payer: Self-pay | Admitting: Family Medicine

## 2021-04-22 ENCOUNTER — Ambulatory Visit (INDEPENDENT_AMBULATORY_CARE_PROVIDER_SITE_OTHER): Payer: BC Managed Care – PPO | Admitting: Family Medicine

## 2021-04-22 VITALS — BP 120/72 | HR 69 | Temp 98.0°F | Ht 70.0 in | Wt 187.4 lb

## 2021-04-22 DIAGNOSIS — Z0000 Encounter for general adult medical examination without abnormal findings: Secondary | ICD-10-CM

## 2021-04-22 LAB — LIPID PANEL
Cholesterol: 183 mg/dL (ref 0–200)
HDL: 47.9 mg/dL (ref >39.00)
LDL Cholesterol: 119 mg/dL — ABNORMAL HIGH (ref 0–99)
NonHDL: 134.72
Total CHOL/HDL Ratio: 4
Triglycerides: 78 mg/dL (ref 0.0–149.0)
VLDL: 15.6 mg/dL (ref 0.0–40.0)

## 2021-04-22 LAB — COMPREHENSIVE METABOLIC PANEL
ALT: 15 U/L (ref 0–53)
AST: 12 U/L (ref 0–37)
Albumin: 4.3 g/dL (ref 3.5–5.2)
Alkaline Phosphatase: 42 U/L (ref 39–117)
BUN: 14 mg/dL (ref 6–23)
CO2: 31 mEq/L (ref 19–32)
Calcium: 9.2 mg/dL (ref 8.4–10.5)
Chloride: 104 mEq/L (ref 96–112)
Creatinine, Ser: 0.98 mg/dL (ref 0.40–1.50)
GFR: 95.35 mL/min (ref >60.00)
Glucose, Bld: 97 mg/dL (ref 70–99)
Potassium: 4 mEq/L (ref 3.5–5.1)
Sodium: 140 mEq/L (ref 135–145)
Total Bilirubin: 0.6 mg/dL (ref 0.2–1.2)
Total Protein: 6.5 g/dL (ref 6.0–8.3)

## 2021-04-22 LAB — CBC
HCT: 42.5 % (ref 39.0–52.0)
Hemoglobin: 14.4 g/dL (ref 13.0–17.0)
MCHC: 34 g/dL (ref 30.0–36.0)
MCV: 88.9 fl (ref 78.0–100.0)
Platelets: 231 10*3/uL (ref 150.0–400.0)
RBC: 4.78 Mil/uL (ref 4.22–5.81)
RDW: 13.3 % (ref 11.5–15.5)
WBC: 6.4 10*3/uL (ref 4.0–10.5)

## 2021-04-22 NOTE — Progress Notes (Signed)
Chief Complaint  Patient presents with  . Annual Exam    Well Male Gregory Dodson is here for a complete physical.   His last physical was >1 year ago.  Current diet: in general, a "healthy" diet.   Current exercise: none Weight trend: stable Fatigue out of ordinary? No. Seat belt? Yes.  Loss of interested in doing things or depression in past 2 weeks? No  Health maintenance Tetanus- Yes HIV- Yes Hep C- Yes  Past Medical History:  Diagnosis Date  . No known problems      Past Surgical History:  Procedure Laterality Date  . TOOTH EXTRACTION      Medications  Takes no meds routinely.    Allergies No Known Allergies  Family History Family History  Problem Relation Age of Onset  . Colon polyps Father   . Cancer Neg Hx     Review of Systems: Constitutional: no fevers or chills Eye:  no recent significant change in vision Ear/Nose/Mouth/Throat:  Ears:  no hearing loss Nose/Mouth/Throat:  no complaints of nasal congestion, no sore throat Cardiovascular:  no chest pain Respiratory:  no shortness of breath Gastrointestinal:  no abdominal pain, no change in bowel habits GU:  Male: negative for dysuria, frequency, and incontinence Musculoskeletal/Extremities:  no pain of the joints Integumentary (Skin/Breast):  no abnormal skin lesions reported Neurologic:  no headaches Endocrine: No unexpected weight changes Hematologic/Lymphatic:  no night sweats  Exam BP 120/72 (BP Location: Left Arm, Patient Position: Sitting, Cuff Size: Normal)   Pulse 69   Temp 98 F (36.7 C) (Oral)   Ht 5\' 10"  (1.778 m)   Wt 187 lb 6 oz (85 kg)   SpO2 96%   BMI 26.89 kg/m  General:  well developed, well nourished, in no apparent distress Skin:  no significant moles, warts, or growths Head:  no masses, lesions, or tenderness Eyes:  pupils equal and round, sclera anicteric without injection Ears:  canals without lesions, TMs shiny without retraction, no obvious effusion, no  erythema Nose:  nares patent, septum midline, mucosa normal Throat/Pharynx:  lips and gingiva without lesion; tongue and uvula midline; non-inflamed pharynx; no exudates or postnasal drainage Neck: neck supple without adenopathy, thyromegaly, or masses Lungs:  clear to auscultation, breath sounds equal bilaterally, no respiratory distress Cardio:  regular rate and rhythm, no bruits, no LE edema Abdomen:  abdomen soft, nontender; bowel sounds normal; no masses or organomegaly Rectal: Deferred Musculoskeletal:  symmetrical muscle groups noted without atrophy or deformity Extremities:  no clubbing, cyanosis, or edema, no deformities, no skin discoloration Neuro:  gait normal; deep tendon reflexes normal and symmetric Psych: well oriented with normal range of affect and appropriate judgment/insight  Assessment and Plan  Well adult exam - Plan: CBC, Comprehensive metabolic panel, Lipid panel   [name]. Counseled on diet and exercise. Counseled on risks and benefits of prostate cancer screening with PSA. The patient agrees to forego screening.  Other orders as above. Follow up in 1 yr pending the above workup. The patient voiced understanding and agreement to the plan.  45 [place], DO 04/22/21 9:25 AM

## 2021-04-22 NOTE — Patient Instructions (Signed)
Give us 2-3 business days to get the results of your labs back.   Keep the diet clean and stay active.  Aim to do some physical exertion for 150 minutes per week. This is typically divided into 5 days per week, 30 minutes per day. The activity should be enough to get your heart rate up. Anything is better than nothing if you have time constraints.  Let us know if you need anything.  

## 2021-08-02 ENCOUNTER — Telehealth: Payer: BC Managed Care – PPO | Admitting: Family Medicine

## 2021-08-02 DIAGNOSIS — J208 Acute bronchitis due to other specified organisms: Secondary | ICD-10-CM

## 2021-08-02 DIAGNOSIS — B9689 Other specified bacterial agents as the cause of diseases classified elsewhere: Secondary | ICD-10-CM

## 2021-08-02 MED ORDER — DOXYCYCLINE HYCLATE 100 MG PO TABS: 100.0000 mg | ORAL_TABLET | Freq: Two times a day (BID) | ORAL | 0 refills | Status: DC

## 2021-08-02 NOTE — Progress Notes (Signed)
Chief Complaint  Patient presents with   Nasal Congestion   Cough    3 weeks    Gregory Dodson here for URI complaints. Due to COVID-19 pandemic, we are interacting via web portal for an electronic face-to-face visit. I verified patient's ID using 2 identifiers. Patient agreed to proceed with visit via this method. Patient is at work, I am at office. Patient and I are present for visit.   Duration: 3 weeks  Associated symptoms: sinus congestion, ear drainage, sore throat, and coughing Denies: sinus pain, rhinorrhea, itchy watery eyes, ear drainage, sore throat, wheezing, shortness of breath, myalgia, and fevers, N/V/D, loss of taste/smell Treatment to date: hot tea Sick contacts: Yes- kids have been sick for most of summer Has not tested for covid.   Past Medical History:  Diagnosis Date   No known problems     Objective No conversational dyspnea Age appropriate judgment and insight Nml affect and mood  Acute bacterial bronchitis - Plan: doxycycline (VIBRA-TABS) 100 MG tablet  Given duration, will tx w abx for 7 d. Continue to push fluids, practice good hand hygiene, cover mouth when coughing. If starting to experience fevers, shaking, or shortness of breath, seek immediate care. F/u in 1 week if no better.  Pt voiced understanding and agreement to the plan.  [name], DO 08/02/21 2:05 PM

## 2021-08-09 ENCOUNTER — Telehealth: Payer: Self-pay | Admitting: Family Medicine

## 2021-08-09 ENCOUNTER — Other Ambulatory Visit: Payer: Self-pay | Admitting: Family Medicine

## 2021-08-09 MED ORDER — PREDNISONE 20 MG PO TABS: 40.0000 mg | ORAL_TABLET | Freq: Every day | ORAL | 0 refills | Status: AC

## 2021-08-09 NOTE — Telephone Encounter (Signed)
Pt. States that he has not seen improvement in cough since being placed on antibiotics. Wanted to see if there was any other advice or steps he can take.

## 2021-08-09 NOTE — Telephone Encounter (Signed)
Patient informed prescription sent in. Informed of PCP instructions.

## 2021-08-09 NOTE — Telephone Encounter (Signed)
Sent in a new rx, sched Tues if still not better, preferably in person. Ty.

## 2021-08-16 ENCOUNTER — Encounter: Payer: Self-pay | Admitting: Family Medicine

## 2021-08-16 ENCOUNTER — Other Ambulatory Visit: Payer: Self-pay

## 2021-08-16 ENCOUNTER — Ambulatory Visit: Payer: BC Managed Care – PPO | Admitting: Family Medicine

## 2021-08-16 VITALS — BP 102/62 | HR 65 | Temp 98.1°F | Ht 70.0 in | Wt 188.2 lb

## 2021-08-16 DIAGNOSIS — J309 Allergic rhinitis, unspecified: Secondary | ICD-10-CM | POA: Diagnosis not present

## 2021-08-16 MED ORDER — MONTELUKAST SODIUM 10 MG PO TABS: 10.0000 mg | ORAL_TABLET | Freq: Every day | ORAL | 3 refills | Status: DC

## 2021-08-16 MED ORDER — LEVOCETIRIZINE DIHYDROCHLORIDE 5 MG PO TABS: 5.0000 mg | ORAL_TABLET | Freq: Every evening | ORAL | 2 refills | Status: DC

## 2021-08-16 NOTE — Patient Instructions (Signed)
Take the levocetirizine/Xyzal daily for 3-5 days. If no improvement, start the montelukast. Send me a message in 2 week if no better.  Let us know if you need anything.

## 2021-08-16 NOTE — Progress Notes (Signed)
Chief Complaint  Patient presents with   Follow-up    Congestion no better    Gregory Dodson here for URI complaints.   Duration: 5 weeks  Associated symptoms: sinus congestion, rhinorrhea, itchy watery eyes, and coughing Denies: sinus pain, ear fullness, ear pain, sore throat, wheezing, shortness of breath, myalgia, and fevers Treatment to date: Prednisone, doxycycline Sick contacts: Yes; kids Tested neg for covid several times.   Past Medical History:  Diagnosis Date   No known problems     Objective BP 102/62   Pulse 65   Temp 98.1 F (36.7 C) (Oral)   Ht 5\' 10"  (1.778 m)   Wt 188 lb 4 oz (85.4 kg)   SpO2 99%   BMI 27.01 kg/m  General: Awake, alert, appears stated age HEENT: AT, [place], ears patent b/l and TM's neg, nares patent w clear discharge, pharynx pink and without exudates, MMM Neck: No masses or asymmetry Heart: RRR Lungs: CTAB, no accessory muscle use Psych: Age appropriate judgment and insight, normal mood and affect  Allergic rhinitis, unspecified seasonality, unspecified trigger - Plan: levocetirizine (XYZAL) 5 MG tablet, montelukast (SINGULAIR) 10 MG tablet  Xyzal 5 mg/d for 3-5 d, add Singulair 10 mg/d if no better. Send message in 2 weeks if no better, will refer to allergy team and initiate INCS.   F/u prn. If starting to experience fevers, shaking, or shortness of breath, seek immediate care. Pt voiced understanding and agreement to the plan.  [place], DO 08/16/21 9:43 AM

## 2022-04-23 ENCOUNTER — Encounter: Payer: Self-pay | Admitting: Family Medicine

## 2022-04-23 ENCOUNTER — Ambulatory Visit (INDEPENDENT_AMBULATORY_CARE_PROVIDER_SITE_OTHER): Payer: BC Managed Care – PPO | Admitting: Family Medicine

## 2022-04-23 VITALS — BP 104/62 | HR 64 | Temp 97.9°F | Ht 70.0 in | Wt 192.4 lb

## 2022-04-23 DIAGNOSIS — Z0000 Encounter for general adult medical examination without abnormal findings: Secondary | ICD-10-CM | POA: Diagnosis not present

## 2022-04-23 LAB — CBC
HCT: 41.5 % (ref 39.0–52.0)
Hemoglobin: 13.9 g/dL (ref 13.0–17.0)
MCHC: 33.6 g/dL (ref 30.0–36.0)
MCV: 89.4 fl (ref 78.0–100.0)
Platelets: 204 10*3/uL (ref 150.0–400.0)
RBC: 4.63 Mil/uL (ref 4.22–5.81)
RDW: 13.2 % (ref 11.5–15.5)
WBC: 4.2 10*3/uL (ref 4.0–10.5)

## 2022-04-23 LAB — COMPREHENSIVE METABOLIC PANEL
ALT: 13 U/L (ref 0–53)
AST: 14 U/L (ref 0–37)
Albumin: 4.3 g/dL (ref 3.5–5.2)
Alkaline Phosphatase: 37 U/L — ABNORMAL LOW (ref 39–117)
BUN: 13 mg/dL (ref 6–23)
CO2: 32 mEq/L (ref 19–32)
Calcium: 9.4 mg/dL (ref 8.4–10.5)
Chloride: 103 mEq/L (ref 96–112)
Creatinine, Ser: 0.98 mg/dL (ref 0.40–1.50)
GFR: 94.68 mL/min (ref >60.00)
Glucose, Bld: 99 mg/dL (ref 70–99)
Potassium: 4.2 mEq/L (ref 3.5–5.1)
Sodium: 139 mEq/L (ref 135–145)
Total Bilirubin: 0.4 mg/dL (ref 0.2–1.2)
Total Protein: 6.2 g/dL (ref 6.0–8.3)

## 2022-04-23 LAB — LIPID PANEL
Cholesterol: 180 mg/dL (ref 0–200)
HDL: 50.7 mg/dL (ref >39.00)
LDL Cholesterol: 113 mg/dL — ABNORMAL HIGH (ref 0–99)
NonHDL: 129.04
Total CHOL/HDL Ratio: 4
Triglycerides: 79 mg/dL (ref 0.0–149.0)
VLDL: 15.8 mg/dL (ref 0.0–40.0)

## 2022-04-23 NOTE — Progress Notes (Signed)
Chief Complaint  Patient presents with   Annual Exam    Well Male EVERT WENRICH is here for a complete physical.   His last physical was >1 year ago.  Current diet: in general, a "healthy" diet.   Current exercise: circuit training Weight trend: stable Fatigue out of ordinary? No. Seat belt? Yes.   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    Allergies No Known Allergies  Family History Family History  Problem Relation Age of Onset   Colon polyps Father    Cancer Neg Hx     Review of Systems: Constitutional: no fevers or chills Eye:  no recent significant change in vision Ear/Nose/Mouth/Throat:  Ears:  no hearing loss Nose/Mouth/Throat:  no complaints of nasal congestion, no sore throat Cardiovascular:  no chest pain Respiratory:  no shortness of breath Gastrointestinal:  no abdominal pain, no change in bowel habits GU:  Male: negative for dysuria, frequency, and incontinence Musculoskeletal/Extremities:  no pain of the joints Integumentary (Skin/Breast):  new mole on R upper chest for 3 mo Neurologic:  no headaches Endocrine: No unexpected weight changes Hematologic/Lymphatic:  no night sweats  Exam BP 104/62   Pulse 64   Temp 97.9 F (36.6 C) (Oral)   Ht 5\' 10"  (1.778 m)   Wt 192 lb 6 oz (87.3 kg)   SpO2 99%   BMI 27.60 kg/m  General:  well developed, well nourished, in no apparent distress Skin: see below; otherwise no significant moles, warts, or growths Head:  no masses, lesions, or tenderness Eyes:  pupils equal and round, sclera anicteric without injection Ears:  canals without lesions, TMs shiny without retraction, no obvious effusion, no erythema Nose:  nares patent, septum midline, mucosa  normal Throat/Pharynx:  lips and gingiva without lesion; tongue and uvula midline; non-inflamed pharynx; no exudates or postnasal drainage Neck: neck supple without adenopathy, thyromegaly, or masses Lungs:  clear to auscultation, breath sounds equal bilaterally, no respiratory distress Cardio:  regular rate and rhythm, no bruits, no LE edema Abdomen:  abdomen soft, nontender; bowel sounds normal; no masses or organomegaly Rectal: Deferred Musculoskeletal:  symmetrical muscle groups noted without atrophy or deformity Extremities:  no clubbing, cyanosis, or edema, no deformities, no skin discoloration Neuro:  gait normal; deep tendon reflexes normal and symmetric Psych: well oriented with normal range of affect and appropriate judgment/insight   R upper chest  Assessment and Plan  Well adult exam - Plan: CBC, Comprehensive metabolic panel, Lipid panel   Well 41 y.o. male. Counseled on diet and exercise. Counseled on risks and benefits of prostate cancer screening with PSA. The patient agrees to forego screening.  Discussed CACS if LDL is still high. He would be interested.  Advanced directive form provided today.  Skin: will monitor. He will go home and take a pic with a ruler next to it and monitor for changes. Offered removal, less likely to be anything sinister given appearance and hx. Looks like a nevus vs lentigo.  Other orders as above. Follow up in 1 yr pending the above workup. The patient voiced understanding and agreement to the plan.  [address] [place], DO 04/23/22 7:12 AM

## 2022-04-23 NOTE — Patient Instructions (Signed)
Give us 2-3 business days to get the results of your labs back.  ? ?Keep the diet clean and stay active. ? ?Advanced directive form provided today.  ? ?Let us know if you need anything. ?

## 2023-05-04 ENCOUNTER — Ambulatory Visit (INDEPENDENT_AMBULATORY_CARE_PROVIDER_SITE_OTHER): Payer: BC Managed Care – PPO | Admitting: Family Medicine

## 2023-05-04 ENCOUNTER — Encounter: Payer: Self-pay | Admitting: Family Medicine

## 2023-05-04 VITALS — BP 110/62 | HR 65 | Temp 97.9°F | Ht 70.0 in | Wt 198.2 lb

## 2023-05-04 DIAGNOSIS — Z0000 Encounter for general adult medical examination without abnormal findings: Secondary | ICD-10-CM | POA: Diagnosis not present

## 2023-05-04 LAB — CBC
HCT: 43 % (ref 39.0–52.0)
Hemoglobin: 14.2 g/dL (ref 13.0–17.0)
MCHC: 33.1 g/dL (ref 30.0–36.0)
MCV: 90.2 fl (ref 78.0–100.0)
Platelets: 209 10*3/uL (ref 150.0–400.0)
RBC: 4.76 Mil/uL (ref 4.22–5.81)
RDW: 13.7 % (ref 11.5–15.5)
WBC: 5.3 10*3/uL (ref 4.0–10.5)

## 2023-05-04 LAB — COMPREHENSIVE METABOLIC PANEL
ALT: 15 U/L (ref 0–53)
AST: 13 U/L (ref 0–37)
Albumin: 4.2 g/dL (ref 3.5–5.2)
Alkaline Phosphatase: 34 U/L — ABNORMAL LOW (ref 39–117)
BUN: 16 mg/dL (ref 6–23)
CO2: 30 mEq/L (ref 19–32)
Calcium: 9 mg/dL (ref 8.4–10.5)
Chloride: 102 mEq/L (ref 96–112)
Creatinine, Ser: 1.03 mg/dL (ref 0.40–1.50)
GFR: 88.55 mL/min (ref >60.00)
Glucose, Bld: 94 mg/dL (ref 70–99)
Potassium: 4.6 mEq/L (ref 3.5–5.1)
Sodium: 139 mEq/L (ref 135–145)
Total Bilirubin: 0.5 mg/dL (ref 0.2–1.2)
Total Protein: 6.2 g/dL (ref 6.0–8.3)

## 2023-05-04 LAB — LIPID PANEL
Cholesterol: 193 mg/dL (ref 0–200)
HDL: 54.3 mg/dL (ref >39.00)
LDL Cholesterol: 120 mg/dL — ABNORMAL HIGH (ref 0–99)
NonHDL: 139.02
Total CHOL/HDL Ratio: 4
Triglycerides: 94 mg/dL (ref 0.0–149.0)
VLDL: 18.8 mg/dL (ref 0.0–40.0)

## 2023-05-04 NOTE — Progress Notes (Signed)
Chief Complaint  Patient presents with   Annual Exam    Well Male Gregory Dodson is here for a complete physical.   His last physical was >1 year ago.  Current diet: in general, diet is OK.   Current exercise: cycling, calisthenics Weight trend: increased a bit Fatigue out of ordinary? No. Seat belt? Yes.   Advanced directive? No  Health maintenance Tetanus- Yes HIV- Yes Hep C- Yes  Past Medical History:  Diagnosis Date   No known problems      Past Surgical History:  Procedure Laterality Date   TOOTH EXTRACTION      Medications  Takes no meds routinely.    Allergies No Known Allergies  Family History Family History  Problem Relation Age of Onset   Colon polyps Father    Cancer Neg Hx     Review of Systems: Constitutional: no fevers or chills Eye:  no recent significant change in vision Ear/Nose/Mouth/Throat:  Ears:  no hearing loss Nose/Mouth/Throat:  no complaints of nasal congestion, no sore throat Cardiovascular:  no chest pain Respiratory:  no shortness of breath Gastrointestinal:  no abdominal pain, no change in bowel habits GU:  Male: negative for dysuria, frequency, and incontinence Musculoskeletal/Extremities:  no pain of the joints Integumentary (Skin/Breast):  no abnormal skin lesions reported Neurologic:  no headaches Endocrine: No unexpected weight changes Hematologic/Lymphatic:  no night sweats  Exam BP 110/62 (BP Location: Left Arm, Patient Position: Sitting, Cuff Size: Normal)   Pulse 65   Temp 97.9 F (36.6 C)   Ht 5\' 10"  (1.778 m)   Wt 198 lb 4 oz (89.9 kg)   SpO2 97%   BMI 28.45 kg/m  General:  well developed, well nourished, in no apparent distress Skin:  no significant moles, warts, or growths Head:  no masses, lesions, or tenderness Eyes:  pupils equal and round, sclera anicteric without injection Ears:  canals without lesions, TMs shiny without retraction, no obvious effusion, no erythema Nose:  nares patent, mucosa  normal Throat/Pharynx:  lips and gingiva without lesion; tongue and uvula midline; non-inflamed pharynx; no exudates or postnasal drainage Neck: neck supple without adenopathy, thyromegaly, or masses Lungs:  clear to auscultation, breath sounds equal bilaterally, no respiratory distress Cardio:  regular rate and rhythm, no bruits, no LE edema Abdomen:  abdomen soft, nontender; bowel sounds normal; no masses or organomegaly Rectal: Deferred Musculoskeletal:  symmetrical muscle groups noted without atrophy or deformity Extremities:  no clubbing, cyanosis, or edema, no deformities, no skin discoloration Neuro:  gait normal; deep tendon reflexes normal and symmetric Psych: well oriented with normal range of affect and appropriate judgment/insight  Assessment and Plan  Well adult exam - Plan: CBC, Comprehensive metabolic panel, Lipid panel   Well 42 y.o. male. Counseled on diet and exercise. Counseled on risks and benefits of prostate cancer screening with PSA. The patient agrees to forego screening.  Other orders as above. Follow up in 1 yr pending the above workup. The patient voiced understanding and agreement to the plan.  Gregory Roche [place], DO 05/04/23 8:00 AM

## 2023-05-04 NOTE — Patient Instructions (Signed)
Give us 2-3 business days to get the results of your labs back.   Keep the diet clean and stay active.  Please get me a copy of your advanced directive form at your convenience.   Let us know if you need anything.
